# Patient Record
Sex: Female | Born: 1935 | Race: White | Hispanic: No | Marital: Married | Smoking: Never smoker
Health system: Southern US, Community
[De-identification: ages and names within clinical notes are randomized; demographics above are authoritative.]

## PROBLEM LIST (undated history)

## (undated) DIAGNOSIS — F419 Anxiety disorder, unspecified: Secondary | ICD-10-CM

## (undated) DIAGNOSIS — I499 Cardiac arrhythmia, unspecified: Secondary | ICD-10-CM

## (undated) DIAGNOSIS — R42 Dizziness and giddiness: Secondary | ICD-10-CM

## (undated) DIAGNOSIS — E7800 Pure hypercholesterolemia, unspecified: Secondary | ICD-10-CM

## (undated) HISTORY — PX: APPENDECTOMY: SHX54

## (undated) HISTORY — PX: TOTAL HIP ARTHROPLASTY: SHX124

## (undated) HISTORY — PX: CHOLECYSTECTOMY: SHX55

## (undated) HISTORY — PX: COLONOSCOPY: SHX174

---

## 2005-03-19 ENCOUNTER — Ambulatory Visit: Payer: Self-pay | Admitting: Unknown Physician Specialty

## 2005-06-08 ENCOUNTER — Ambulatory Visit: Payer: Self-pay | Admitting: Unknown Physician Specialty

## 2006-08-10 ENCOUNTER — Ambulatory Visit: Payer: Self-pay | Admitting: Unknown Physician Specialty

## 2010-10-01 ENCOUNTER — Ambulatory Visit: Payer: Self-pay | Admitting: Unknown Physician Specialty

## 2011-11-11 ENCOUNTER — Ambulatory Visit: Payer: Self-pay | Admitting: Unknown Physician Specialty

## 2011-12-30 ENCOUNTER — Ambulatory Visit: Payer: Self-pay | Admitting: Unknown Physician Specialty

## 2011-12-31 LAB — PATHOLOGY REPORT

## 2013-02-17 ENCOUNTER — Ambulatory Visit: Payer: Self-pay | Admitting: Unknown Physician Specialty

## 2013-08-20 ENCOUNTER — Inpatient Hospital Stay: Payer: Self-pay

## 2013-08-20 LAB — URINALYSIS, COMPLETE
Glucose,UR: NEGATIVE mg/dL (ref 0–75)
Ketone: NEGATIVE
Leukocyte Esterase: NEGATIVE
Nitrite: NEGATIVE
Ph: 6 (ref 4.5–8.0)
Protein: NEGATIVE
Specific Gravity: 1.01 (ref 1.003–1.030)
WBC UR: <1 /HPF (ref 0–5)

## 2013-08-20 LAB — COMPREHENSIVE METABOLIC PANEL
Anion Gap: 9 (ref 7–16)
BUN: 16 mg/dL (ref 7–18)
Bilirubin,Total: 0.3 mg/dL (ref 0.2–1.0)
Chloride: 106 mmol/L (ref 98–107)
SGOT(AST): 27 U/L (ref 15–37)
Sodium: 140 mmol/L (ref 136–145)

## 2013-08-20 LAB — CBC
HGB: 13.3 g/dL (ref 12.0–16.0)
MCHC: 34.5 g/dL (ref 32.0–36.0)
MCV: 93 fL (ref 80–100)
RBC: 4.17 10*6/uL (ref 3.80–5.20)
WBC: 11.8 10*3/uL — ABNORMAL HIGH (ref 3.6–11.0)

## 2013-08-20 LAB — APTT: Activated PTT: 26.8 secs (ref 23.6–35.9)

## 2013-08-21 LAB — BASIC METABOLIC PANEL
Anion Gap: 8 (ref 7–16)
BUN: 14 mg/dL (ref 7–18)
Calcium, Total: 8.7 mg/dL (ref 8.5–10.1)
Chloride: 107 mmol/L (ref 98–107)
Co2: 27 mmol/L (ref 21–32)
Creatinine: 0.98 mg/dL (ref 0.60–1.30)
EGFR (Non-African Amer.): 54 — ABNORMAL LOW
Glucose: 137 mg/dL — ABNORMAL HIGH (ref 65–99)
Osmolality: 286 (ref 275–301)
Potassium: 3.4 mmol/L — ABNORMAL LOW (ref 3.5–5.1)
Sodium: 142 mmol/L (ref 136–145)

## 2013-08-21 LAB — CBC WITH DIFFERENTIAL/PLATELET
Basophil #: 0 10*3/uL (ref 0.0–0.1)
Basophil %: 0.3 %
Eosinophil %: 0.4 %
HCT: 38.4 % (ref 35.0–47.0)
MCH: 31.7 pg (ref 26.0–34.0)
MCHC: 33.9 g/dL (ref 32.0–36.0)
Monocyte #: 0.9 x10 3/mm (ref 0.2–0.9)
Platelet: 187 10*3/uL (ref 150–440)
RBC: 4.1 10*6/uL (ref 3.80–5.20)

## 2013-08-21 LAB — TSH: Thyroid Stimulating Horm: 5.15 u[IU]/mL — ABNORMAL HIGH

## 2013-08-21 LAB — LIPID PANEL
Triglycerides: 97 mg/dL (ref 0–200)
VLDL Cholesterol, Calc: 19 mg/dL (ref 5–40)

## 2013-08-21 LAB — T4, FREE: Free Thyroxine: 1.02 ng/dL (ref 0.76–1.46)

## 2013-08-23 LAB — BASIC METABOLIC PANEL
Anion Gap: 7 (ref 7–16)
Chloride: 108 mmol/L — ABNORMAL HIGH (ref 98–107)
Co2: 25 mmol/L (ref 21–32)
Creatinine: 0.94 mg/dL (ref 0.60–1.30)
EGFR (African American): >60
EGFR (Non-African Amer.): 57 — ABNORMAL LOW
Glucose: 111 mg/dL — ABNORMAL HIGH (ref 65–99)
Sodium: 140 mmol/L (ref 136–145)

## 2013-08-24 LAB — BASIC METABOLIC PANEL
BUN: 11 mg/dL (ref 7–18)
Chloride: 108 mmol/L — ABNORMAL HIGH (ref 98–107)
Creatinine: 0.9 mg/dL (ref 0.60–1.30)
EGFR (African American): >60
EGFR (Non-African Amer.): >60
Glucose: 111 mg/dL — ABNORMAL HIGH (ref 65–99)
Osmolality: 279 (ref 275–301)
Potassium: 3.8 mmol/L (ref 3.5–5.1)

## 2014-02-21 ENCOUNTER — Ambulatory Visit: Payer: Self-pay | Admitting: Internal Medicine

## 2015-03-22 NOTE — Consult Note (Signed)
Brief Consult Note: Diagnosis: displaced left femoral neck fracture.   Patient was seen by consultant.   Consult note dictated.   Recommend to proceed with surgery or procedure.   Comments: discussed procedure, need to replace femoral head to allow for ambulation. plan on surgery later today.  Electronic Signatures: Leitha SchullerMenz, Chee Kinslow J (MD)  (Signed 22-Sep-14 07:21)  Authored: Brief Consult Note   Last Updated: 22-Sep-14 07:21 by Leitha SchullerMenz, Terrian Ridlon J (MD)

## 2015-03-22 NOTE — H&P (Signed)
PATIENT NAME:  Brianna Anderson, Brianna Anderson MR#:  [id number] DATE OF BIRTH:  February 22, 1931  DATE OF ADMISSION:  08/21/2013  PRIMARY CARE PHYSICIAN: Silver HugueninAileen Miller, MD  REFERRING PHYSICIAN: Lowella FairyJohn Woodruff, MD  CHIEF COMPLAINT: Fall.   HISTORY OF PRESENT ILLNESS: The patient is an 81 year old healthy Caucasian female with past medical history of glaucoma and hyperlipidemia who stepped down at prayer and sustained a fall. The patient's family members called EMS and the patient was brought into the ER. In the ER, the patient was diagnosed with left femoral neck fracture regarding which hospitalist team is called to admit the patient. During my examination, the patient is reporting that her pain is well-controlled. Family members are at bedside. The patient is hard of hearing, but denies any chest pain, shortness of breath, abdominal pain, nausea, vomiting or diarrhea.   PAST MEDICAL HISTORY: Glaucoma and hyperlipidemia.   PAST SURGICAL HISTORY: Cholecystectomy, appendectomy.   ALLERGIES: No known drug allergies.   PSYCHOSOCIAL HISTORY: Lives at home. Daughter lives with her. The patient denies tobacco, denies any alcohol or illicit drug usage.   FAMILY HISTORY: Mom had cardiomegaly and dad deceased with heart attack.   REVIEW OF SYSTEMS:  CONSTITUTIONAL: Denies fever, fatigue.  EYES: Denies blurry vision. No cataracts. EARS, NOSE, THROAT: No epistaxis, discharge. Hard of hearing. RESPIRATORY: Denies cough, COPD. CARDIOVASCULAR: Denies chest pain, palpitations.  GASTROINTESTINAL: Denies nausea, vomiting, diarrhea.  GENITOURINARY: No dysuria or hematuria.  ENDOCRINE: No polyuria, nocturia or thyroid problems.  HEMATOLOGIC AND LYMPHATIC: No anemia, easy bruising.  INTEGUMENTARY: No acne, rash, lesions.  MUSCULOSKELETAL: Complaining of left-sided hip pain from cervical neck fracture. Denies gout.  NEUROLOGIC: No vertigo or ataxia.  PSYCHIATRIC: No ADD, OCD.   PHYSICAL EXAMINATION: VITAL SIGNS:  Temperature 97.9, pulse 62, respirations 16, blood pressure 128/59, pulse ox 93%.  GENERAL APPEARANCE: Not in any acute distress. Moderately built and nourished. Hard of hearing.  HEENT: Normocephalic, atraumatic. Pupils are equally reactive to light and accommodation. No sinus tenderness. No postnasal drip. Moist mucous membranes.  NECK: Supple. No JVD. No thyromegaly.  LUNGS: Clear to auscultation bilaterally. No axillary muscle usage. No anterior chest wall tenderness on palpation.  HEART: S1 and S2 normal. Regular rate and rhythm.  GASTROINTESTINAL: Soft. Bowel sounds are positive in all 4 quadrants.  ABDOMEN: Nontender, nondistended. No hepatosplenomegaly.  NEUROLOGIC: Awake, alert and oriented x 3. Motor is not elicited on the left lower extremity as the patient had a fracture. Motor and sensory on the right lower extremity, bilateral upper extremities is intact. Reflexes are 2+.  EXTREMITIES: No cyanosis. No clubbing.  SKIN: Warm to touch. Normal turgor. No rashes. No lesions. Normal mood and affect.   LABORATORY AND DIAGNOSTICS: LFTs are normal. WBC 11.8, hemoglobin 13.3, hematocrit 38.7, platelets 205. PT/INR are normal.   Urinalysis: Yellow in color, clear in appearance. Nitrite and leukocyte esterase negative. BMP: Potassium is 3.2. GFR is 54. Glucose 127, BUN and creatinine are normal. Sodium 140. Serum osmolality and calcium are also normal.  Twelve-lead EKG: Sinus bradycardia with marked sinus arrhythmia, first-degree AV block, PR interval is 218. Nonspecific ST-T wave changes.   Left femur and pelvic x-ray has revealed left femoral neck fracture.  Chest x-ray: No acute abnormalities.   ASSESSMENT AND PLAN: An 81 year old African American female brought into the ER after she sustained a fall. Will be admitted with the following assessment and plan:  1.  Left femoral neck fracture. We will keep her n.p.o. and provide IVF today. Ortho consult is placed  to Dr. Rosita Kea. Pain management  with morphine. Medically cleared for surgery. 2.  Hyperlipidemia. Will check fasting lipid panel.  3.  Glaucoma.  4.  Hip pain from femoral neck fracture.   She is FULL CODE. Daughter is her medical power of attorney. Diagnosis and plan of care was discussed in detail with the patient. She is aware of the plan. We will transfer the patient to [facility] in a.m.   TOTAL TIME SPENT ON ADMISSION: 45 minutes. ____________________________ Ramonita Lab, MD ag:sb D: 08/21/2013 02:01:59 ET T: 08/21/2013 07:07:08 ET JOB#: [id number]  cc: Ramonita Lab, MD, <Dictator> Leitha Schuller, MD Yetta Flock, MD  Ramonita Lab MD ELECTRONICALLY SIGNED 09/01/2013 7:12

## 2015-03-22 NOTE — Op Note (Signed)
PATIENT NAME:  Brianna LombardSTEWART, Glendoris J MR#:  [id number] DATE OF BIRTH:  [birth year]  DATE OF PROCEDURE:  08/22/2013  PREOPERATIVE DIAGNOSIS: Left femoral neck fracture with osteoarthritis.   POSTOPERATIVE DIAGNOSIS: Left femoral neck fracture with osteoarthritis.   PROCEDURE: Left total hip replacement.   ANESTHESIA: Spinal.   SURGEON: Leitha SchullerMichael J. Gradie Ohm, MD  DESCRIPTION OF PROCEDURE: The patient was brought to the Operating Room and after adequate anesthesia was obtained, the patient was placed on the operative table with the right leg on a well-padded table, left leg in the Medacta attachment. After prepping and bringing in the C-arm and getting preop alignment checked, the timeout and patient identification procedures were completed a direct anterior approach was made centered over the TFL and greater trochanter. The tensor fascia muscle was incised with hemostasis being achieved with electrocautery during the case. The TFL was retracted laterally and the deep fascia incised. The rectus fascia was then opened and the lateral femoral circumflex vessels identified and ligated. The anterior capsule was then exposed and a capsulotomy carried out to create a flap for subsequent placement of the Charnley retractor. Femoral neck cut was made and the head was removed along with a section of the neck. There was significant synovitis within the joint and a curette was used to remove this.  Reaming was carried out to 50 mm. A 50 mm trial fit very well with good bleeding bone and the final component was impacted into place, the VersaFit DM cup placed.   Next, the leg was externally rotated and the pubofemoral and ischiofemoral ligaments were released. The leg was brought down into extension and adduction. Box osteotome was used followed by sequential broaching. A #2 stem fit quite well and with trials, final component sizes were determined. The #2 stem was impacted into position followed by a size M, 28 mm head and 50 mm  DM liner for the 50 mm VersaFit cup.  The head and liner were assembled and impacted onto the trunnion of the stem.   The wound was thoroughly irrigated and then the hip reduced. The wound was infiltrated with 0.5% Sensorcaine for postoperative analgesia followed by a heavy Quill for the deep fascia, a subcutaneous drain, 2-0 Quill subcutaneously and skin staples. Xeroform, 4 x 4's, ABD and tape applied. The patient was sent to the recovery room in stable condition.   ESTIMATED BLOOD LOSS: 200 mL.   COMPLICATIONS: None.   SPECIMEN: Removed femoral head.   IMPLANTS: Medacta AMIS stem size 2, 50 mm VersaFit cup DM with associated liner and M 28 mm femoral head.     ____________________________ Leitha SchullerMichael J. Ambrosio Reuter, MD mjm:cs D: 08/22/2013 19:51:54 ET T: 08/22/2013 20:07:07 ET JOB#: [id number]  cc: Leitha SchullerMichael J. Danie Diehl, MD, <Dictator> Nolon BussingMICHAEL J [facility] IrmoMENZ MD ELECTRONICALLY SIGNED 08/23/2013 8:16

## 2015-03-22 NOTE — Consult Note (Signed)
PATIENT NAME:  Brianna LombardSTEWART, Retha J MR#:  [id number] DATE OF BIRTH:  [birth year]  DATE OF CONSULTATION:  08/21/2013  CONSULTING PHYSICIAN:  Leitha SchullerMichael J. Shamanda Len, MD  REASON FOR CONSULTATION: Left hip fracture.   HISTORY OF PRESENT ILLNESS: The patient is an 81 year old who was at her daughter's. There was a tear pear on the ground that she slipped on. 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 She complains of severe hip pain. On exam, her left leg is in traction. She is neurovascularly intact. She is able to flex and extend her toes and has palpable pulses. Skin around the hip is intact as well with no ecchymosis. There is some slight external rotation and shortening.   RADIOLOGICAL DATA: X-rays reveal a completely displaced femoral neck fracture with mild arthritis.   CLINICAL IMPRESSION: Displaced femoral neck fracture with arthritis.   PLAN: For anterior total later today.  Risks, benefits, possible complications, in particular deep infection,  blood clot,  perioperative medical complications were discussed.   ____________________________ Leitha SchullerMichael J. Anecia Nusbaum, MD mjm:sg D: 08/21/2013 07:25:45 ET T: 08/21/2013 07:31:34 ET JOB#: [id number]  cc: Leitha SchullerMichael J. Wael Maestas, MD, <Dictator>  Leitha SchullerMICHAEL J Milagro Belmares MD ELECTRONICALLY SIGNED 08/21/2013 15:08

## 2015-03-22 NOTE — Discharge Summary (Signed)
PATIENT NAME:  Brianna Anderson, Brianna Anderson MR#:  [id number] DATE OF BIRTH:  1931-03-09  DATE OF ADMISSION:  08/20/2013 DATE OF DISCHARGE:  08/25/2013  ADMITTING DIAGNOSIS: Left femoral neck fracture with osteoarthritis.   DISCHARGE DIAGNOSIS: Left femoral neck fracture with osteoarthritis.   PROCEDURE: Left total hip replacement.   ANESTHESIA: Spinal.   SURGEON: Leitha SchullerMichael Anderson. Menz, M.D.   ESTIMATED BLOOD LOSS: 200 mL.   COMPLICATIONS: None.   SPECIMEN REMOVED: Femoral head.   IMPLANTS: Medacta AMIS stem size 2, 50 mm Versafitcup DM with associated liner and M 28 mm femoral head.    HISTORY OF PRESENT ILLNESS: The patient is an 81 year old healthy Caucasian female with past medical history of glaucoma, hyperlipidemia, who stepped down at prayer and sustained a fall. The patient's family members called EMS and the patient was brought to the ER. In the ER, the patient was diagnosed with left femoral neck fracture. Regarding, the hospitalist team was called to admit the patient. During my examination, the patient reported that her pain was well controlled. Family members were at bedside. The patient is hard of hearing but denies any chest pain, shortness of breath, abdominal pain, nausea, vomiting or diarrhea.   PHYSICAL EXAMINATION: GENERAL: Not in any distress. Moderately built and nourished, hard of hearing.  HEENT: Normocephalic, atraumatic. Pupils equal, round, and reactive to light and accommodation. No sinus tenderness. No postnasal drip. Moist mucosal membranes.  NECK: Supple. No JVD. No thyromegaly.  LUNGS: Clear to auscultation bilaterally. No accessory muscle usage. No anterior chest wall tenderness on palpation.  HEART: S1, S2 normal, regular rate and rhythm.  GASTROINTESTINAL: Soft bowel sounds are positive in all 4 quadrants. Abdomen nontender, nondistended. No hepatosplenomegaly.  NEUROLOGIC: Awake, alert and oriented x 3. Motor is not elicited on the left lower extremity, as the patient  had a fracture. Motor and sensory on the right lower extremity and bilateral upper extremities is intact. Reflexes are 2+.  EXTREMITIES: No cyanosis. No clubbing.  SKIN: Warm to touch. Normal turgor. No rashes. No lesions.  PSYCHIATRIC: Normal mood and affect.   HOSPITAL COURSE: The patient was admitted to the hospital on 08/20/2013. She was cleared by medicine for surgery. On 08/22/2013, she underwent a left total hip replacement by Dr. Rosita KeaMenz. On 08/23/2013, which was postop day 1, the patient had acute postop blood loss anemia of 11.1. She did have hypokalemia at 3.4. She was supplemented with Klor-Con 40 mEq. On postoperative day 2, 08/24/2013, potassium was up. The patient's hemoglobin was stable. She had good progress with physical therapy. On postoperative day 3, 08/25/2013, the patient was  doing very well. Labs and vital signs were stable and she was medically cleared to go home with home health physical therapy.   CONDITION AT DISCHARGE: Stable.   DISCHARGE INSTRUCTIONS: The patient may increase weight-bearing on the affected extremity. She needs to wear thigh-high TED hose on both legs and remove 1 hour per 8-hour shift. Use incentive spirometry every hour while awake. Encourage cough and deep breathing. Do not get the dressing or bandage wet or dirty. No concentrated sweets or sugar. Apply an ice pack to the affected area. Call [facility] if any of the following occur: Bright red bleeding from the incision or wound, fever above 101.5 degrees, redness, swelling or drainage at the incision. Call [facility] if you experience any increased leg pain, numbness or weakness in your legs or bowel or bladder symptoms. Home physical therapy has been arranged for rehab program at home. She  needs to call if a therapist has not contacted her within 48 hours of return home. She will follow up with [facility] in 2 weeks.   DISCHARGE MEDICATIONS: She will continue home medications. The patient will  go home with Lovenox 40 mg 1 tab p.o. daily x 14 days and she will continue with hydrocodone 5/325, 1 to 2 tablets every 6 hours as needed for pain.   ____________________________ T. Cranston Neighbor, PA-C tcg:jm D: 09/11/2013 14:20:20 ET T: 09/11/2013 14:34:09 ET JOB#: [id number]  cc: T. Cranston Neighbor, PA-C, <Dictator> Evon Slack [place] ELECTRONICALLY SIGNED 09/19/2013 11:16

## 2015-08-12 IMAGING — CR PELVIS - 1-2 VIEW
1 series · 2 of 2 positions shown · non-contrast
Comparison: none

REASON FOR EXAM: pain
COMMENTS:

PROCEDURE:     DXR - DXR PELVIS AP ONLY  - August 20, 2013  [DATE]
RESULT:

[Series 1: ap · 0.17mm/px · 2 of 2 slices shown]
[im 1/2]
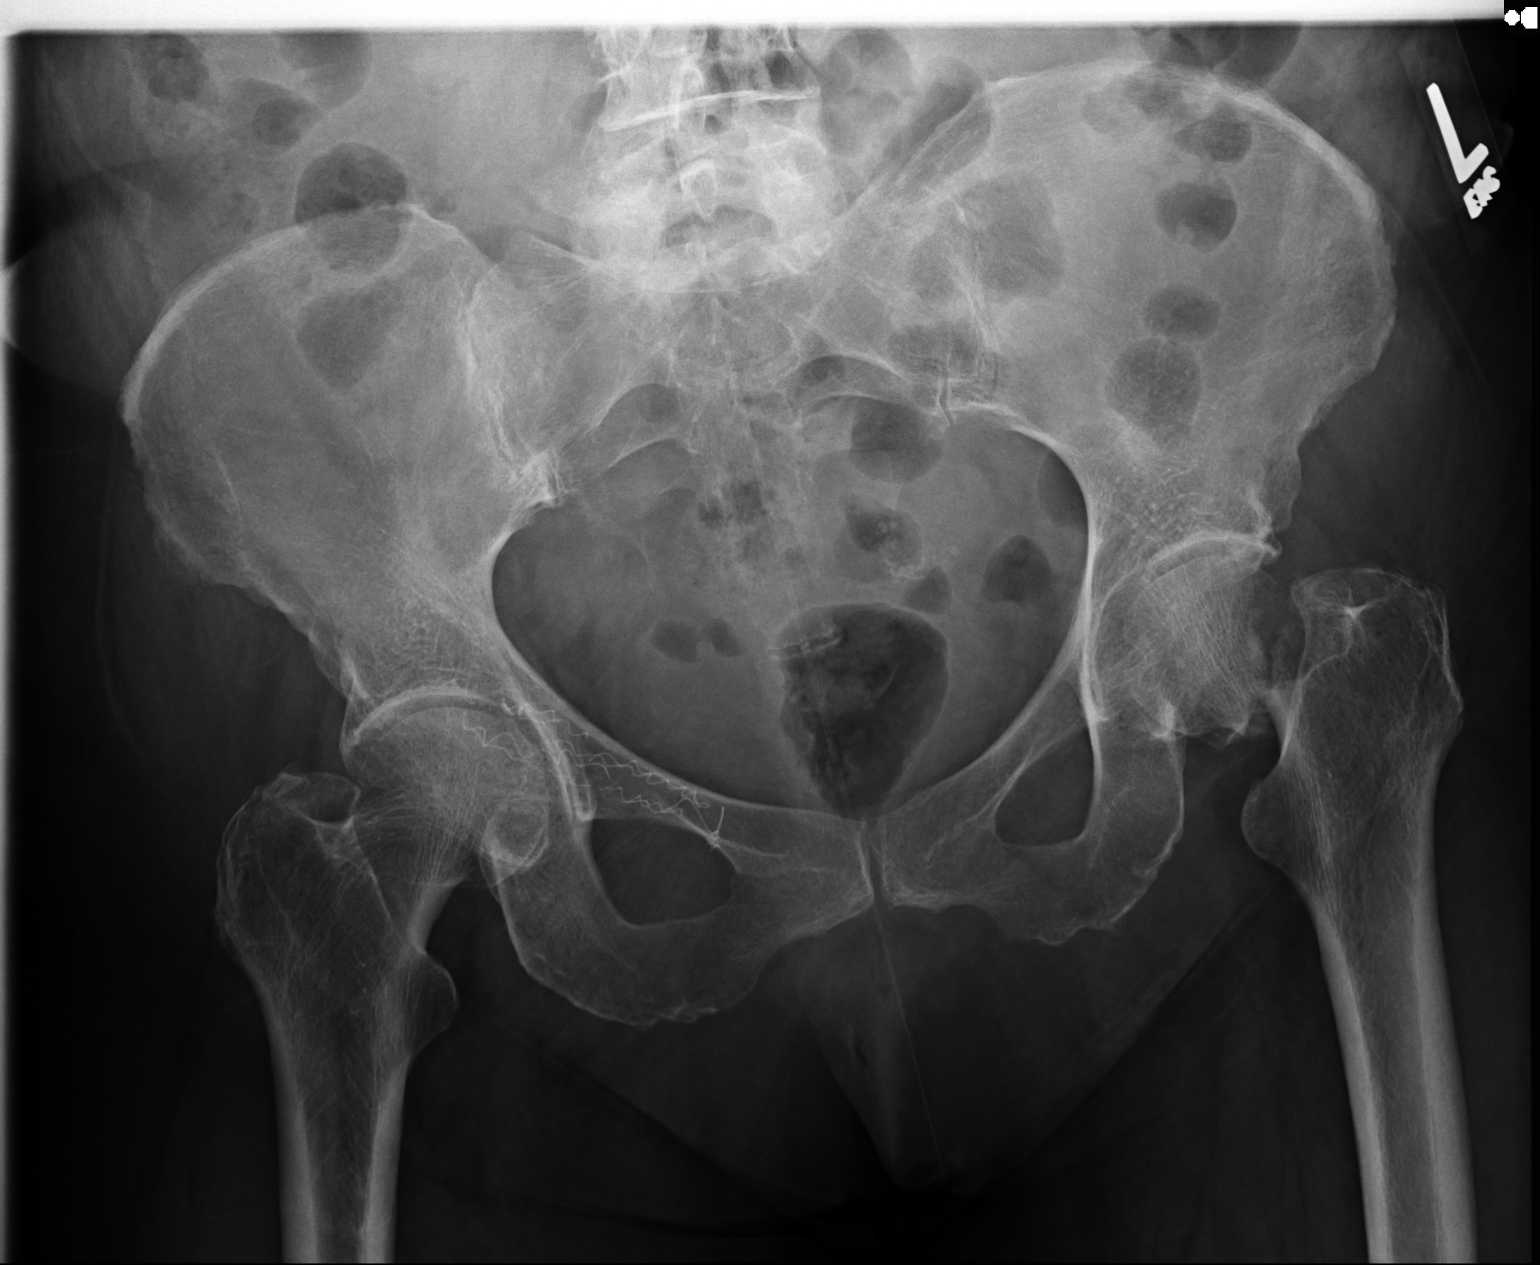
[im 2/2]
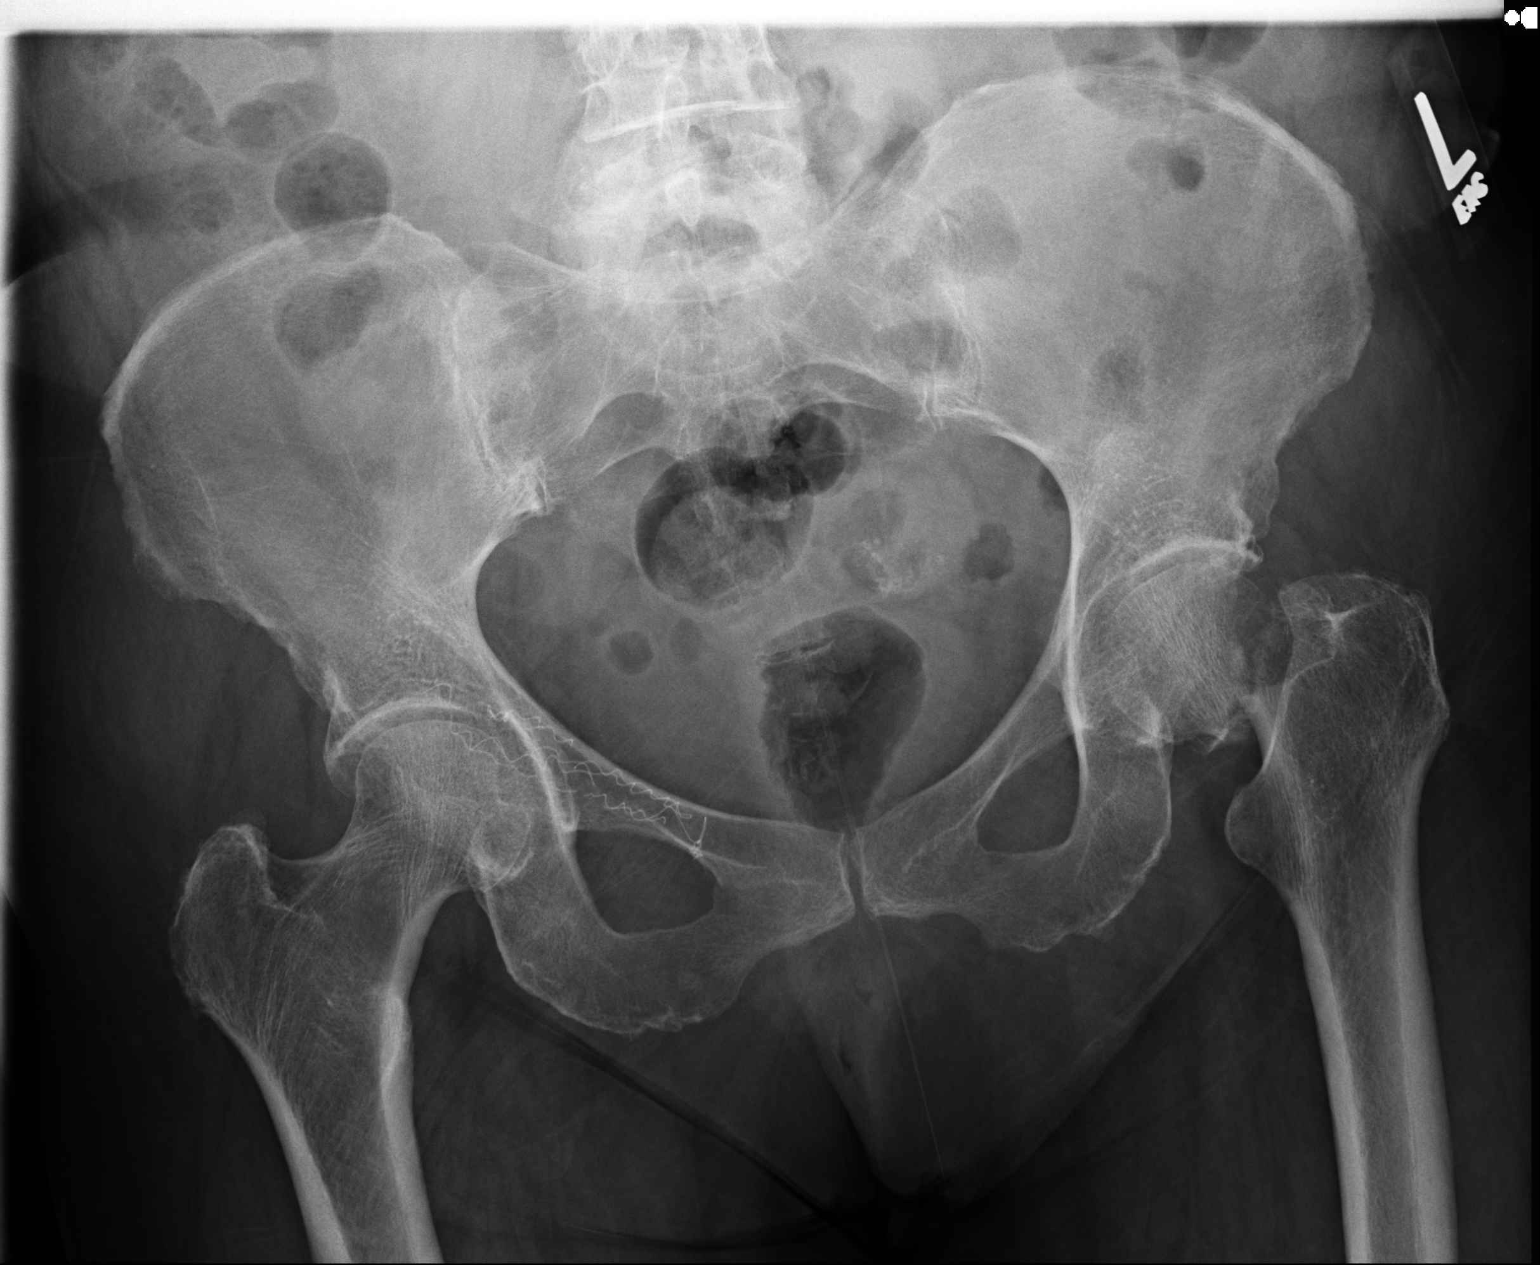

[2 of 2 positions shown; findings below may reference images not displayed]

FINDINGS: A subcapital femoral neck fracture is identified involving the
left hip with superior displacement and minimal medial angulation.
IMPRESSION: Subcapital femoral neck fracture on the left.

## 2015-08-14 IMAGING — CR DG HIP COMPLETE 2+V*L*
1 series · 2 of 2 positions shown · non-contrast
Comparison: none

REASON FOR EXAM: s/p THA
COMMENTS:   Bedside (portable):Y

PROCEDURE:     DXR - DXR HIP LEFT COMPLETE  - August 22, 2013  [DATE]
RESULT:     Patient is status post left hip replacement with good anatomic
alignment. No acute abnormalities identified.

[Series 1: ap · 0.17mm/px · 2 of 2 slices shown]
[im 1/2]
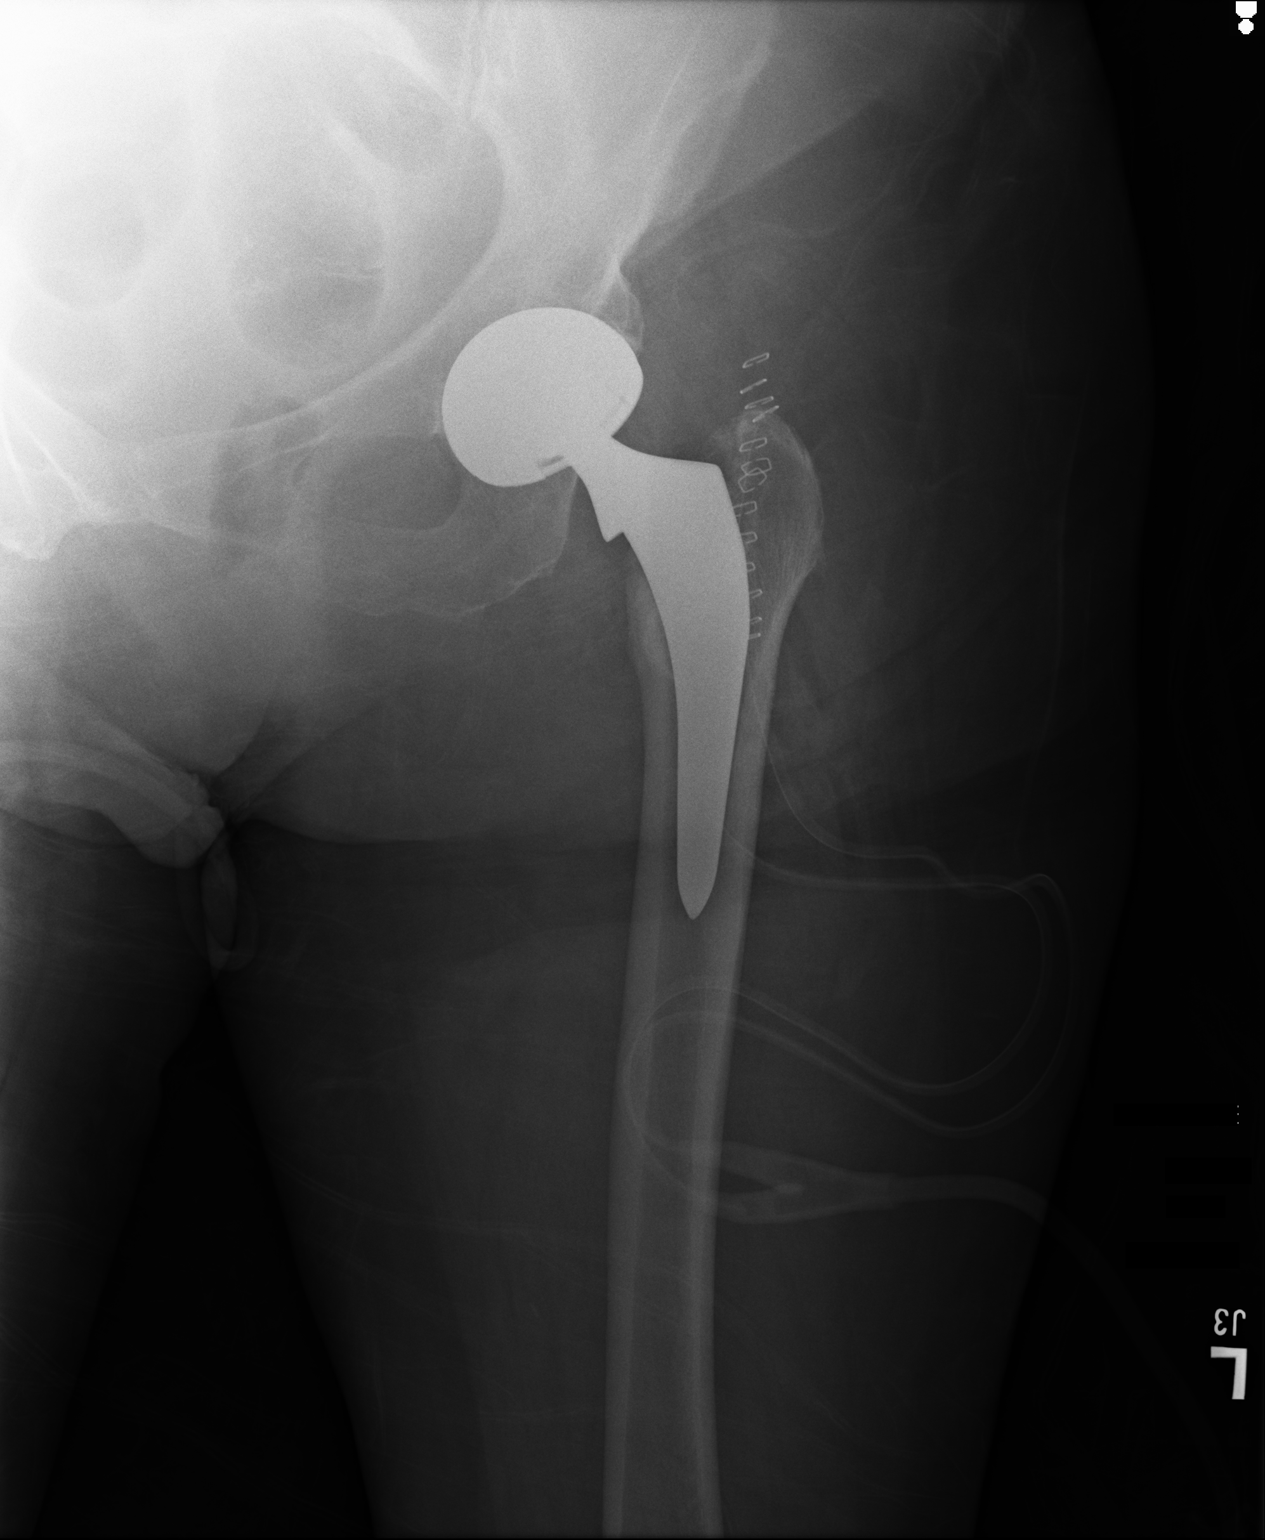
[im 2/2]
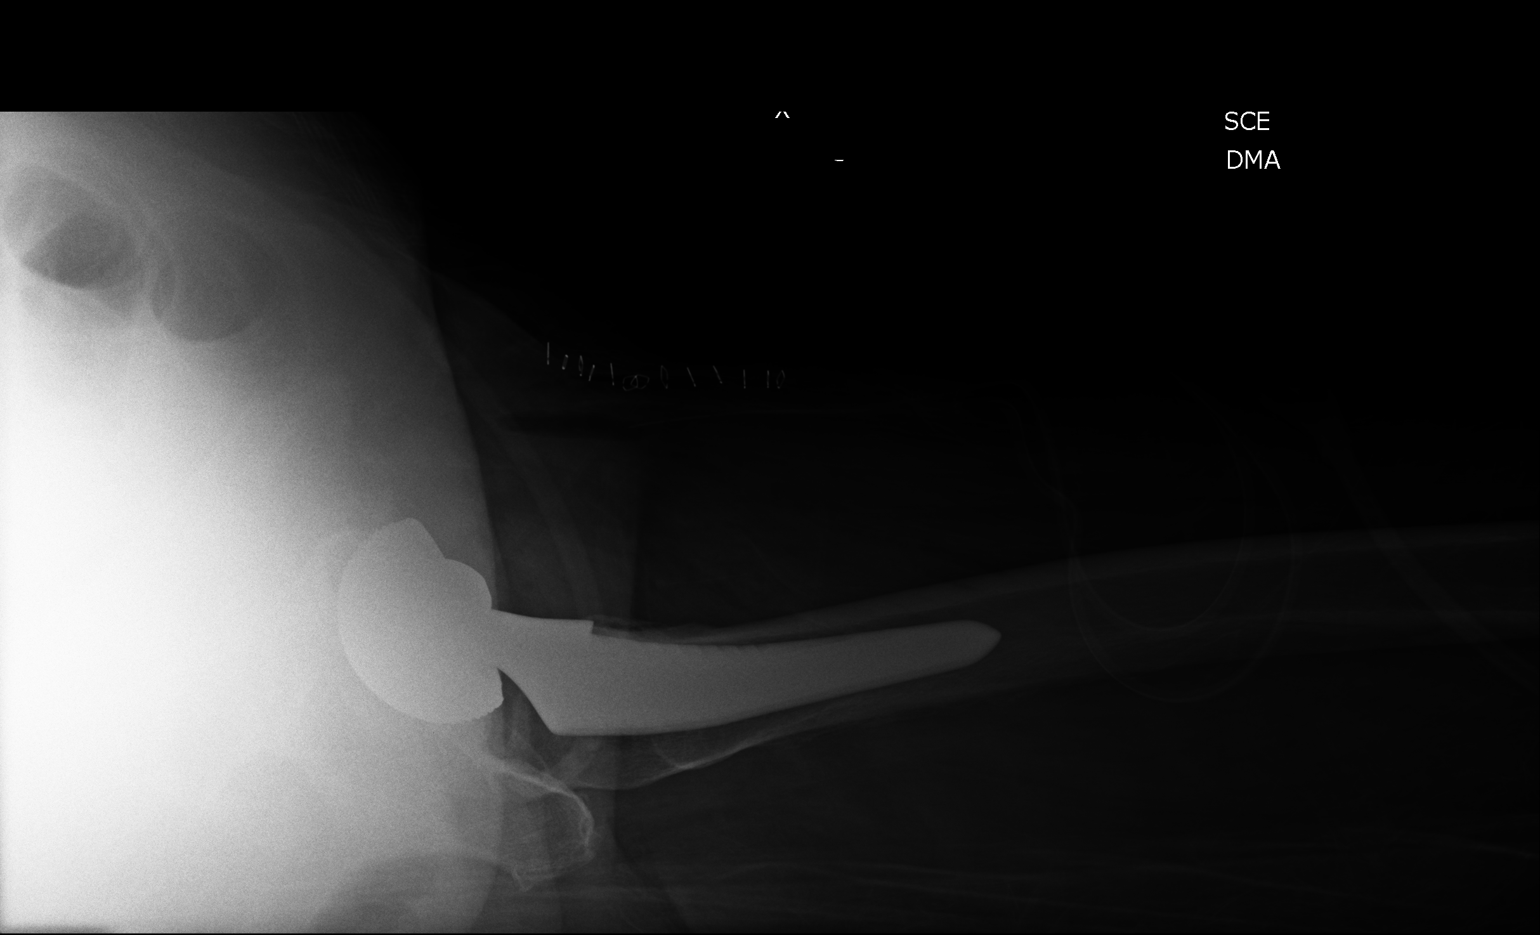

[2 of 2 positions shown; findings below may reference images not displayed]

IMPRESSION: Patient status post left hip replacement with good anatomic
alignment.

## 2016-07-22 ENCOUNTER — Encounter: Payer: Self-pay | Admitting: *Deleted

## 2016-07-22 NOTE — Discharge Instructions (Signed)

## 2016-07-27 ENCOUNTER — Ambulatory Visit
Admission: RE | Admit: 2016-07-27 | Discharge: 2016-07-27 | Disposition: A | Discharge status: Home / Self Care | Payer: Medicare Other | Source: Ambulatory Visit | Attending: Ophthalmology | Admitting: Ophthalmology

## 2016-07-27 ENCOUNTER — Ambulatory Visit: Payer: Medicare Other | Admitting: Anesthesiology

## 2016-07-27 ENCOUNTER — Encounter
Admission: RE | Disposition: A | Discharge status: Home / Self Care | Payer: Self-pay | Source: Ambulatory Visit | Attending: Ophthalmology

## 2016-07-27 ENCOUNTER — Encounter: Payer: Self-pay | Admitting: *Deleted

## 2016-07-27 DIAGNOSIS — H2511 Age-related nuclear cataract, right eye: Secondary | ICD-10-CM | POA: Insufficient documentation

## 2016-07-27 DIAGNOSIS — F419 Anxiety disorder, unspecified: Secondary | ICD-10-CM | POA: Insufficient documentation

## 2016-07-27 HISTORY — DX: Pure hypercholesterolemia, unspecified: E78.00

## 2016-07-27 HISTORY — DX: Dizziness and giddiness: R42

## 2016-07-27 HISTORY — PX: CATARACT EXTRACTION W/PHACO: SHX586

## 2016-07-27 HISTORY — DX: Anxiety disorder, unspecified: F41.9

## 2016-07-27 HISTORY — DX: Cardiac arrhythmia, unspecified: I49.9

## 2016-07-27 SURGERY — PHACOEMULSIFICATION, CATARACT, WITH IOL INSERTION
Anesthesia: Monitor Anesthesia Care | Laterality: Right | Wound class: Clean

## 2016-07-27 MED ORDER — LACTATED RINGERS IV SOLN: INTRAVENOUS | Status: DC

## 2016-07-27 MED ORDER — BRIMONIDINE TARTRATE 0.2 % OP SOLN
OPHTHALMIC | Status: DC | PRN
  Administered 2016-07-27: 1 [drp] via OPHTHALMIC

## 2016-07-27 MED ORDER — TIMOLOL MALEATE 0.5 % OP SOLN
OPHTHALMIC | Status: DC | PRN
  Administered 2016-07-27: 1 [drp] via OPHTHALMIC

## 2016-07-27 MED ORDER — NA HYALUR & NA CHOND-NA HYALUR 0.4-0.35 ML IO KIT
PACK | INTRAOCULAR | Status: DC | PRN
  Administered 2016-07-27: 1 mL via INTRAOCULAR

## 2016-07-27 MED ORDER — ARMC OPHTHALMIC DILATING GEL
1.0000 "application " | OPHTHALMIC | Status: DC | PRN
  Administered 2016-07-27 (×2): 1 via OPHTHALMIC

## 2016-07-27 MED ORDER — FENTANYL CITRATE (PF) 100 MCG/2ML IJ SOLN
INTRAMUSCULAR | Status: DC | PRN
  Administered 2016-07-27 (×2): 50 ug via INTRAVENOUS

## 2016-07-27 MED ORDER — LIDOCAINE HCL (PF) 4 % IJ SOLN
INTRAMUSCULAR | Status: DC | PRN
  Administered 2016-07-27: 1 mL via OPHTHALMIC

## 2016-07-27 MED ORDER — POVIDONE-IODINE 5 % OP SOLN
1.0000 "application " | OPHTHALMIC | Status: DC | PRN
  Administered 2016-07-27: 1 via OPHTHALMIC

## 2016-07-27 MED ORDER — EPINEPHRINE HCL 1 MG/ML IJ SOLN
INTRAOCULAR | Status: DC | PRN
  Administered 2016-07-27: 70 mL via OPHTHALMIC

## 2016-07-27 MED ORDER — TETRACAINE HCL 0.5 % OP SOLN
1.0000 [drp] | OPHTHALMIC | Status: DC | PRN
  Administered 2016-07-27: 1 [drp] via OPHTHALMIC

## 2016-07-27 MED ORDER — MIDAZOLAM HCL 2 MG/2ML IJ SOLN
INTRAMUSCULAR | Status: DC | PRN
Start: 2016-07-27 — End: 2016-07-27
  Administered 2016-07-27 (×2): 1 mg via INTRAVENOUS

## 2016-07-27 MED ORDER — CEFUROXIME OPHTHALMIC INJECTION 1 MG/0.1 ML
INJECTION | OPHTHALMIC | Status: DC | PRN
  Administered 2016-07-27: 0.1 mL via INTRACAMERAL

## 2016-07-27 SURGICAL SUPPLY — 28 items

## 2016-07-27 NOTE — Anesthesia Preprocedure Evaluation (Signed)
Anesthesia Evaluation  Patient identified by MRN, date of birth, ID band Patient awake    Reviewed: Allergy & Precautions, NPO status , Patient's Chart, lab work & pertinent test results  History of Anesthesia Complications Negative for: history of anesthetic complications  Airway Mallampati: II  TM Distance: >3 FB Neck ROM: full    Dental  (+) Edentulous Upper, Edentulous Lower   Pulmonary neg pulmonary ROS,    Pulmonary exam normal        Cardiovascular negative cardio ROS Normal cardiovascular exam     Neuro/Psych Anxiety negative neurological ROS     GI/Hepatic negative GI ROS, Neg liver ROS,   Endo/Other  negative endocrine ROS  Renal/GU negative Renal ROS     Musculoskeletal   Abdominal   Peds  Hematology negative hematology ROS (+)   Anesthesia Other Findings   Reproductive/Obstetrics                             Anesthesia Physical Anesthesia Plan  ASA: II  Anesthesia Plan: MAC   Post-op Pain Management:    Induction:   Airway Management Planned:   Additional Equipment:   Intra-op Plan:   Post-operative Plan:   Informed Consent: I have reviewed the patients History and Physical, chart, labs and discussed the procedure including the risks, benefits and alternatives for the proposed anesthesia with the patient or authorized representative who has indicated his/her understanding and acceptance.     Plan Discussed with:   Anesthesia Plan Comments:         Anesthesia Quick Evaluation

## 2016-07-27 NOTE — Anesthesia Postprocedure Evaluation (Signed)
Anesthesia Post Note  Patient: Brianna Anderson  Procedure(s) Performed: Procedure(s) (LRB): CATARACT EXTRACTION PHACO AND INTRAOCULAR LENS PLACEMENT (IOC) (Right)  Patient location during evaluation: PACU Anesthesia Type: MAC Level of consciousness: awake Pain management: pain level controlled Vital Signs Assessment: post-procedure vital signs reviewed and stable Respiratory status: spontaneous breathing Cardiovascular status: stable Postop Assessment: no headache Anesthetic complications: no    Verner Cholunkle, III,  Deija Buhrman D

## 2016-07-27 NOTE — Anesthesia Procedure Notes (Signed)
Procedure Name: MAC Performed by: Nayah Lukens Pre-anesthesia Checklist: Patient identified, Emergency Drugs available, Suction available, Timeout performed and Patient being monitored Patient Re-evaluated:Patient Re-evaluated prior to inductionOxygen Delivery Method: Nasal cannula Placement Confirmation: positive ETCO2     

## 2016-07-27 NOTE — Transfer of Care (Signed)
Immediate Anesthesia Transfer of Care Note  Patient: Brianna Anderson  Procedure(s) Performed: Procedure(s) with comments: CATARACT EXTRACTION PHACO AND INTRAOCULAR LENS PLACEMENT (IOC) (Right) - RIGHT VISION BLUE  Patient Location: PACU  Anesthesia Type: MAC  Level of Consciousness: awake, alert  and patient cooperative  Airway and Oxygen Therapy: Patient Spontanous Breathing and Patient connected to supplemental oxygen  Post-op Assessment: Post-op Vital signs reviewed, Patient's Cardiovascular Status Stable, Respiratory Function Stable, Patent Airway and No signs of Nausea or vomiting  Post-op Vital Signs: Reviewed and stable  Complications: No apparent anesthesia complications

## 2016-07-27 NOTE — Op Note (Signed)
Date of Surgery: 07/27/2016  PREOPERATIVE DIAGNOSES: Visually significant (brunescent) nuclear sclerotic cataract, right eye.  POSTOPERATIVE DIAGNOSES: Same  PROCEDURES PERFORMED: Cataract extraction with intraocular lens implant, right eye with the aid of vision blue dye.  SURGEON: Devin GoingAnita P. Vin, M.D.  ANESTHESIA: MAC and topical  IMPLANTS: [id number] +22.5 D  Implant Name Type Inv. Item Serial No. Manufacturer Lot No. LRB No. Used  LENS IOL ACRYSOF IQ 22.5 - [id number] Intraocular Lens LENS IOL ACRYSOF IQ 22.5 [id number] ALCON   Right 1     COMPLICATIONS: None.  DESCRIPTION OF PROCEDURE: Therapeutic options were discussed with the patient preoperatively, including a discussion of risks and benefits of surgery. Informed consent was obtained. An IOL-Master and immersion biometry were used to take the lens measurements, and a dilated fundus exam was performed within 6 months of the surgical date.  The patient was premedicated and brought to the operating room and placed on the operating table in the supine position. After adequate anesthesia, the patient was prepped and draped in the usual sterile ophthalmic fashion. A wire lid speculum was inserted and the microscope was positioned. A Superblade was used to create a paracentesis site at the limbus and a small amount of dilute preservative free lidocaine was instilled into the anterior chamber, followed by Vision Blue, then dispersive viscoelastic. A clear corneal incision was created temporally using a 2.4 mm keratome blade. Capsulorrhexis was then performed. In situ phacoemulsification was performed.  Cortical material was removed with the irrigation-aspiration unit. Dispersive viscoelastic was instilled to open the capsular bag. A posterior chamber intraocular lens with the specifications above was inserted and positioned. Irrigation-aspiration was used to remove all viscoelastic. Cefuroxime 1cc was instilled into the anterior chamber, and  the corneal incision was checked and found to be water tight. The eyelid speculum was removed.  The operative eye was covered with protective goggles after instilling 1 drop of timolol and brimonidine. The patient tolerated the procedure well. There were no complications.

## 2016-07-28 ENCOUNTER — Encounter: Payer: Self-pay | Admitting: Ophthalmology

## 2016-08-26 ENCOUNTER — Encounter: Payer: Self-pay | Admitting: *Deleted

## 2016-08-27 NOTE — Discharge Instructions (Signed)

## 2016-08-31 ENCOUNTER — Encounter: Payer: Self-pay | Admitting: *Deleted

## 2016-08-31 ENCOUNTER — Ambulatory Visit: Payer: Medicare Other | Admitting: Anesthesiology

## 2016-08-31 ENCOUNTER — Ambulatory Visit
Admission: RE | Admit: 2016-08-31 | Discharge: 2016-08-31 | Disposition: A | Discharge status: Home / Self Care | Payer: Medicare Other | Source: Ambulatory Visit | Attending: Ophthalmology | Admitting: Ophthalmology

## 2016-08-31 ENCOUNTER — Encounter
Admission: RE | Disposition: A | Discharge status: Home / Self Care | Payer: Self-pay | Source: Ambulatory Visit | Attending: Ophthalmology

## 2016-08-31 DIAGNOSIS — F419 Anxiety disorder, unspecified: Secondary | ICD-10-CM | POA: Diagnosis not present

## 2016-08-31 DIAGNOSIS — Z96649 Presence of unspecified artificial hip joint: Secondary | ICD-10-CM | POA: Insufficient documentation

## 2016-08-31 DIAGNOSIS — H2512 Age-related nuclear cataract, left eye: Secondary | ICD-10-CM | POA: Diagnosis present

## 2016-08-31 DIAGNOSIS — Z79899 Other long term (current) drug therapy: Secondary | ICD-10-CM | POA: Insufficient documentation

## 2016-08-31 DIAGNOSIS — E7800 Pure hypercholesterolemia, unspecified: Secondary | ICD-10-CM | POA: Diagnosis not present

## 2016-08-31 DIAGNOSIS — F17290 Nicotine dependence, other tobacco product, uncomplicated: Secondary | ICD-10-CM | POA: Diagnosis not present

## 2016-08-31 HISTORY — PX: CATARACT EXTRACTION W/PHACO: SHX586

## 2016-08-31 SURGERY — PHACOEMULSIFICATION, CATARACT, WITH IOL INSERTION
Anesthesia: Monitor Anesthesia Care | Laterality: Left

## 2016-08-31 MED ORDER — BRIMONIDINE TARTRATE 0.2 % OP SOLN
OPHTHALMIC | Status: DC | PRN
  Administered 2016-08-31: 1 [drp] via OPHTHALMIC

## 2016-08-31 MED ORDER — MOXIFLOXACIN HCL 0.5 % OP SOLN
1.0000 [drp] | OPHTHALMIC | Status: DC | PRN
  Administered 2016-08-31 (×3): 1 [drp] via OPHTHALMIC

## 2016-08-31 MED ORDER — CEFUROXIME OPHTHALMIC INJECTION 1 MG/0.1 ML
INJECTION | OPHTHALMIC | Status: DC | PRN
  Administered 2016-08-31: 0.1 mL via INTRACAMERAL

## 2016-08-31 MED ORDER — ACETAMINOPHEN 325 MG PO TABS: 325.0000 mg | ORAL_TABLET | ORAL | Status: DC | PRN

## 2016-08-31 MED ORDER — TIMOLOL MALEATE 0.5 % OP SOLN
OPHTHALMIC | Status: DC | PRN
  Administered 2016-08-31: 1 [drp] via OPHTHALMIC

## 2016-08-31 MED ORDER — FENTANYL CITRATE (PF) 100 MCG/2ML IJ SOLN
INTRAMUSCULAR | Status: DC | PRN
  Administered 2016-08-31: 50 ug via INTRAVENOUS

## 2016-08-31 MED ORDER — ARMC OPHTHALMIC DILATING DROPS
1.0000 "application " | OPHTHALMIC | Status: DC | PRN
  Administered 2016-08-31 (×3): 1 via OPHTHALMIC

## 2016-08-31 MED ORDER — ACETAMINOPHEN 160 MG/5ML PO SOLN: 325.0000 mg | ORAL | Status: DC | PRN

## 2016-08-31 MED ORDER — EPINEPHRINE HCL 1 MG/ML IJ SOLN
INTRAOCULAR | Status: DC | PRN
  Administered 2016-08-31: 65 mL via OPHTHALMIC

## 2016-08-31 MED ORDER — LIDOCAINE HCL (PF) 4 % IJ SOLN
INTRAOCULAR | Status: DC | PRN
  Administered 2016-08-31: 1 mL via OPHTHALMIC

## 2016-08-31 MED ORDER — TRYPAN BLUE 0.06 % OP SOLN
OPHTHALMIC | Status: DC | PRN
  Administered 2016-08-31: 0.5 mL via INTRAOCULAR

## 2016-08-31 MED ORDER — NA HYALUR & NA CHOND-NA HYALUR 0.4-0.35 ML IO KIT
PACK | INTRAOCULAR | Status: DC | PRN
  Administered 2016-08-31: 1 mL via INTRAOCULAR

## 2016-08-31 MED ORDER — MIDAZOLAM HCL 2 MG/2ML IJ SOLN
INTRAMUSCULAR | Status: DC | PRN
  Administered 2016-08-31: 2 mg via INTRAVENOUS

## 2016-08-31 SURGICAL SUPPLY — 28 items
APPLICATOR COTTON TIP 3IN (MISCELLANEOUS) ×3 IMPLANT
CANNULA ANT/CHMB 27GA (MISCELLANEOUS) ×9 IMPLANT
DISSECTOR HYDRO NUCLEUS 50X22 (MISCELLANEOUS) ×3 IMPLANT
GLOVE BIO SURGEON STRL SZ7 (GLOVE) ×3 IMPLANT
GLOVE SURG LX 6.5 MICRO (GLOVE) ×2
GLOVE SURG LX STRL 6.5 MICRO (GLOVE) ×1 IMPLANT
GOWN STRL REUS W/ TWL LRG LVL3 (GOWN DISPOSABLE) ×2 IMPLANT
GOWN STRL REUS W/TWL LRG LVL3 (GOWN DISPOSABLE) ×4
LENS IOL ACRYSOF IQ 22.5 (Intraocular Lens) ×3 IMPLANT
MARKER SKIN DUAL TIP RULER LAB (MISCELLANEOUS) ×3 IMPLANT
NEEDLE FILTER BLUNT 18X 1/2SAF (NEEDLE) ×2
NEEDLE FILTER BLUNT 18X1 1/2 (NEEDLE) ×1 IMPLANT
PACK CATARACT BRASINGTON (MISCELLANEOUS) ×3 IMPLANT
PACK EYE AFTER SURG (MISCELLANEOUS) ×3 IMPLANT
PACK OPTHALMIC (MISCELLANEOUS) ×3 IMPLANT
RING MALYGIN 7.0 (MISCELLANEOUS) IMPLANT
SOL BAL SALT 15ML (MISCELLANEOUS)
SOLUTION BAL SALT 15ML (MISCELLANEOUS) IMPLANT
SUT ETHILON 10-0 CS-B-6CS-B-6 (SUTURE)
SUT VICRYL  9 0 (SUTURE)
SUT VICRYL 9 0 (SUTURE) IMPLANT
SUTURE EHLN 10-0 CS-B-6CS-B-6 (SUTURE) IMPLANT
SYR 3ML LL SCALE MARK (SYRINGE) ×6 IMPLANT
SYR TB 1ML LUER SLIP (SYRINGE) ×3 IMPLANT
WATER STERILE IRR 250ML POUR (IV SOLUTION) ×3 IMPLANT
WATER STERILE IRR 500ML POUR (IV SOLUTION) IMPLANT
WICK EYE OCUCEL (MISCELLANEOUS) IMPLANT
WIPE NON LINTING 3.25X3.25 (MISCELLANEOUS) ×3 IMPLANT

## 2016-08-31 NOTE — Anesthesia Postprocedure Evaluation (Signed)
Anesthesia Post Note  Patient: Brianna Anderson  Procedure(s) Performed: Procedure(s) (LRB): CATARACT EXTRACTION PHACO AND INTRAOCULAR LENS PLACEMENT (IOC) (Left)  Patient location during evaluation: PACU Anesthesia Type: MAC Level of consciousness: awake and alert and oriented Pain management: satisfactory to patient Vital Signs Assessment: post-procedure vital signs reviewed and stable Respiratory status: spontaneous breathing, nonlabored ventilation and respiratory function stable Cardiovascular status: blood pressure returned to baseline and stable Postop Assessment: Adequate PO intake and No signs of nausea or vomiting Anesthetic complications: no    Cherly BeachStella, Roshelle Traub J

## 2016-08-31 NOTE — Transfer of Care (Signed)
Immediate Anesthesia Transfer of Care Note  Patient: Brianna Anderson  Procedure(s) Performed: Procedure(s) with comments: CATARACT EXTRACTION PHACO AND INTRAOCULAR LENS PLACEMENT (IOC) (Left) - VISION BLUE LEFT  Patient Location: PACU  Anesthesia Type: MAC  Level of Consciousness: awake, alert  and patient cooperative  Airway and Oxygen Therapy: Patient Spontanous Breathing and Patient connected to supplemental oxygen  Post-op Assessment: Post-op Vital signs reviewed, Patient's Cardiovascular Status Stable, Respiratory Function Stable, Patent Airway and No signs of Nausea or vomiting  Post-op Vital Signs: Reviewed and stable  Complications: No apparent anesthesia complications

## 2016-08-31 NOTE — H&P (Signed)
H+P reviewed and is up to date, please see paper chart.  

## 2016-08-31 NOTE — Anesthesia Procedure Notes (Signed)
Procedure Name: MAC Performed by: Joshu Furukawa Pre-anesthesia Checklist: Patient identified, Emergency Drugs available, Suction available, Timeout performed and Patient being monitored Patient Re-evaluated:Patient Re-evaluated prior to inductionOxygen Delivery Method: Nasal cannula Placement Confirmation: positive ETCO2     

## 2016-08-31 NOTE — Op Note (Signed)
Date of Surgery: 08/31/2016  PREOPERATIVE DIAGNOSES: Visually significant brunescent nuclear sclerotic cataract, left eye.  POSTOPERATIVE DIAGNOSES: Same  PROCEDURES PERFORMED: Cataract extraction with intraocular lens implant, left eye with aid of vision blue dye.  SURGEON: Devin GoingAnita P. Vin, M.D.  ANESTHESIA: MAC and topical  IMPLANTS: [id number] +22.5 D  Implant Name Type Inv. Item Serial No. Manufacturer Lot No. LRB No. Used  LENS IOL ACRYSOF IQ 22.5 - [id number] Intraocular Lens LENS IOL ACRYSOF IQ 22.5 [id number] AL[employer]   Left 1    COMPLICATIONS: None.  DESCRIPTION OF PROCEDURE: Therapeutic options were discussed with the patient preoperatively, including a discussion of risks and benefits of surgery. Informed consent was obtained. An IOL-Master and immersion biometry were used to take the lens measurements, and a dilated fundus exam was performed within 6 months of the surgical date.  The patient was premedicated and brought to the operating room and placed on the operating table in the supine position. After adequate anesthesia, the patient was prepped and draped in the usual sterile ophthalmic fashion. A wire lid speculum was inserted and the microscope was positioned. A Superblade was used to create a paracentesis site at the limbus and a small amount of dilute preservative free lidocaine was instilled into the anterior chamber, followed by air, vision blue, then dispersive viscoelastic. A clear corneal incision was created temporally using a 2.4 mm keratome blade. Capsulorrhexis was then performed. In situ phacoemulsification was performed.  Cortical material was removed with the irrigation-aspiration unit. Dispersive viscoelastic was instilled to open the capsular bag. A posterior chamber intraocular lens with the specifications above was inserted and positioned. Irrigation-aspiration was used to remove all viscoelastic. Cefuroxime 1cc was instilled into the anterior chamber, and the  corneal incision was checked and found to be water tight. The eyelid speculum was removed.  The operative eye was covered with protective goggles after instilling 1 drop of timolol and brimonidine. The patient tolerated the procedure well. There were no complications.

## 2016-08-31 NOTE — Anesthesia Preprocedure Evaluation (Signed)
Anesthesia Evaluation  Patient identified by MRN, date of birth, ID band Patient awake    Reviewed: Allergy & Precautions, H&P , NPO status , Patient's Chart, lab work & pertinent test results  Airway Mallampati: II  TM Distance: >3 FB Neck ROM: full    Dental no notable dental hx.    Pulmonary    Pulmonary exam normal        Cardiovascular Normal cardiovascular exam     Neuro/Psych    GI/Hepatic   Endo/Other    Renal/GU      Musculoskeletal   Abdominal   Peds  Hematology   Anesthesia Other Findings   Reproductive/Obstetrics                             Anesthesia Physical Anesthesia Plan  ASA: II  Anesthesia Plan: MAC   Post-op Pain Management:    Induction:   Airway Management Planned:   Additional Equipment:   Intra-op Plan:   Post-operative Plan:   Informed Consent: I have reviewed the patients History and Physical, chart, labs and discussed the procedure including the risks, benefits and alternatives for the proposed anesthesia with the patient or authorized representative who has indicated his/her understanding and acceptance.     Plan Discussed with:   Anesthesia Plan Comments:         Anesthesia Quick Evaluation  

## 2016-09-01 ENCOUNTER — Encounter: Payer: Self-pay | Admitting: Ophthalmology

## 2019-06-12 ENCOUNTER — Ambulatory Visit: Payer: Self-pay | Admitting: Urology

## 2019-08-03 ENCOUNTER — Ambulatory Visit: Payer: Self-pay | Admitting: Urology
